# Patient Record
Sex: Female | Born: 1970 | Race: White | Hispanic: No | Smoking: Current every day smoker
Health system: Southern US, Community
[De-identification: ages and names within clinical notes are randomized; demographics above are authoritative.]

## PROBLEM LIST (undated history)

## (undated) HISTORY — PX: ESOPHAGEAL DILATION: SHX303

---

## 2006-05-30 ENCOUNTER — Emergency Department (HOSPITAL_COMMUNITY): Admission: EM | Admit: 2006-05-30 | Discharge: 2006-05-30 | Payer: Self-pay | Admitting: Emergency Medicine

## 2006-06-09 ENCOUNTER — Ambulatory Visit: Payer: Self-pay | Admitting: *Deleted

## 2006-06-09 ENCOUNTER — Inpatient Hospital Stay (HOSPITAL_COMMUNITY): Admission: AD | Admit: 2006-06-09 | Discharge: 2006-06-14 | Payer: Self-pay | Admitting: *Deleted

## 2009-08-02 ENCOUNTER — Ambulatory Visit: Payer: Self-pay | Admitting: Interventional Radiology

## 2009-08-02 ENCOUNTER — Emergency Department (HOSPITAL_BASED_OUTPATIENT_CLINIC_OR_DEPARTMENT_OTHER): Admission: EM | Admit: 2009-08-02 | Discharge: 2009-08-02 | Payer: Self-pay | Admitting: Emergency Medicine

## 2009-08-12 ENCOUNTER — Emergency Department (HOSPITAL_COMMUNITY): Admission: EM | Admit: 2009-08-12 | Discharge: 2009-08-12 | Payer: Self-pay | Admitting: Emergency Medicine

## 2010-07-25 NOTE — H&P (Signed)
NAMESAUMYA, Alyssa Rodgers NO.:  [id number]   MEDICAL RECORD NO.:  [id number]          PATIENT TYPE:  IPS   LOCATION:  0503                          FACILITY:  [facility]   PHYSICIAN:  Jasmine Pang, M.D. DATE OF BIRTH:  04/04/1970   DATE OF ADMISSION:  06/09/2006  DATE OF DISCHARGE:                       PSYCHIATRIC ADMISSION ASSESSMENT   This a 40 year old separated white female involuntary committed on  06/08/2006.   HISTORY OF PRESENT ILLNESS:  The patient presents with a history  depression, anxiety, having suicidal thoughts with different plans that  she has not acted on.  The patient is here on petition.  Papers state  the patient was having suicidal thoughts of multiple plans and  significant substance abuse over the past 5 months.  The patient reports  that she has a history of alcohol, cocaine and opiate use.  Her drinking  and drug use have escalated after separating from her husband.  She has  been drinking approximately a six pack daily.  She started using at the  age of 38, was introduced with some substances through her parents.  Denies any psychotic symptoms.   PAST PSYCHIATRIC HISTORY:  First admission to [facility].  She was told that she may be bipolar.  In the past has been on Zoloft,  Wellbutrin and Effexor.   SOCIAL HISTORY:  A 40 year old separated white female, has two children.  She states her mother lives with her.  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   FAMILY HISTORY:  Chart indicates father committed suicide.  Mother  history of substance abuse.   ALCOHOL AND DRUG HISTORY:  The patient smokes.  The patient has been  using several substances.  Denies any IV drug use.  Denies any seizures  or blackouts.  Primary care Summar Mcglothlin is none.   MEDICAL PROBLEMS:  Denies any acute or chronic health issues.   MEDICATIONS:  None.   DRUG ALLERGIES:  None.   PHYSICAL EXAMINATION:  GENERAL:  The patient was assessed at  [facility].  VITAL SIGNS:  Her temperature is 97.7, 118 heart rate, 16 respirations,  blood pressure 124/67, 99% saturated, approximately 5 feet 4 inches  tall, approximately 135 pounds.   LABORATORY DATA:  Pregnancy test is negative.  Urinalysis is negative.  Drug screen is negative.  Her CBC is within normal limits.  Alcohol  level is less than 10.  This is a young female, standing up in her room,  looking out the window, very tearful and agitated.   MENTAL STATUS EXAM:  Fully alert.  She is in her room.  There is little  eye contact.  She is sobbing throughout most of the interview.  Speech  is clear, normal rate and tone.  The patient's mood is depressed and  anxious.  The patient is tearful and agitated.  Thought processes are  coherent.  There is no evidence of psychosis.  Cognitive function  intact.  Memory is fair.  Judgment is fair.  Insight is fair.  Poor  impulse control.  Concentration somewhat decreased at this time.  AXIS I:  Depressive disorder NOS, polysubstance abuse.  Rule out  dependence.  AXIS II:  Deferred.  AXIS III:  No acute or chronic health issues.  AXIS IV:  Problems with primary support group, possible problems with  occupation.  The patient is concerned about being admitted and losing  her job.  Other psychosocial problems.  AXIS V:  Current is 35.   PLAN:  Contract for safety.  Stabilize mood thinking.  We will put  patient on Librium protocol.  Will have Risperdal available on a p.r.n.  basis.  Work on relapse prevention.  Case manager is to assess follow-up  and will consider family session with the patient's support group.  The  patient is to remain alcohol and drug-free, be medication compliant and  attend individual group therapy to increase coping skills.  Length of  stay is 4 to 5 days.      Landry Corporal, N.P.      Jasmine Pang, M.D.  Electronically Signed    JO/MEDQ  D:  06/10/2006  T:  06/10/2006  Job:  [id number]

## 2010-07-25 NOTE — Discharge Summary (Signed)
NAMEABBEYGAIL, IGOE NO.:  [id number]   MEDICAL RECORD NO.:  [id number]          PATIENT TYPE:  IPS   LOCATION:  0503                          FACILITY:  [facility]   PHYSICIAN:  Geoffery Lyons, M.D.      DATE OF BIRTH:  1970-03-14   DATE OF ADMISSION:  06/09/2006  DATE OF DISCHARGE:  06/14/2006                               DISCHARGE SUMMARY   CHIEF COMPLAINT/HISTORY OF PRESENT ILLNESS:  This is the first admission  to [facility] for this 40 year old separated white  female involuntarily committed with history of depression, anxiety,  having suicidal thoughts with different plans that she had up to the  note acted on. She was involuntarily committed.  She was having suicidal  thoughts of multiple plan with significant substance abuse over the past  25-months. History of alcohol and cocaine and opiate use. Drinking and  drug use escalated after separated from her husband. Drinking a six-pack  daily.   PAST PSYCHIATRIC HISTORY:  [facility] Told she might  be bipolar.  She had been on Zoloft, Wellbutrin and Effexor.   ALCOHOL AND DRUG HISTORY:  Endorsed using several substances. History of  alcohol and cocaine and opiate use but the use has escalated, a six-pack  daily.  Started using age 18 was introduced into some substance through  her parents as she claims.   MEDICAL HISTORY:  Denies a history of any major medical conditions.   MEDICATIONS:  None.   PHYSICAL EXAMINATION:  Performed and failed to show any acute findings.   LABORATORY WORK:  Pregnancy test negative. Drug screening negative.  CBC  within normal limits.  White blood cells 7.8, hemoglobin 15.4 sodium  141, potassium 3.9, glucose 11.   MENTAL STATUS EXAM:  Upon admission revealed an alert cooperative  female.  Little eye contact. Initially sobbing throughout most of the  interview.  Speech was clear, normal in rate and tone, depressed and  anxious, tearful,  agitated.  Thought processes logical, coherent and  relevant.  No evidence of delusions, suicidal ruminations no plan.  No  homicidal ideas.  Cognition well-preserved.   ADMISSION DIAGNOSES:  AXIS I: Major depressive disorder, alcohol,  cocaine and opiate abuse, rule out dependence.  AXIS II: No diagnosis.  AXIS III:  No diagnosis.  AXIS IV: Moderate.  AXIS V:  On admission 35, highest GAF in the last year 60.   COURSE IN THE HOSPITAL:  She was admitted.  She was started in  individual and group psychotherapy.  She was detoxified with Librium,  given Ambien for sleep. Some Risperdal was added and she was given  trazodone for sleep when the Ambien was not effective. She endorsed that  she had been supporting everyone for 10 years as she claims and she  cannot get support back. Has no friends. Thought she was selfish, career  driven.  She was career-driven, got pregnant, was wanting to go to [place] and settled for [facility]. Separated in October. In a new  relationship.  Seven and nine-year-old children.  Three years  of drug  use cocaine, opiates. Endorses shoulder pain. She was fired from a job  she liked.  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 Has been in a full time job for the last  month.  Does not want to be with the children, having a hard time  dealing with them. Wants to get herself back cannot not stand her  mother. When raising the children wine, beer increased use, then  decreased to six pack a day. On April 4th she was somatically focused,  symptom focused, anxiety building up to agitation, worried about her  situation, wanting to meet with the husband and his father as wanted to  be sure that they understand that at this particular time she could not  take care of the children and wanted them to known that she did not want  to give the children away, but that she needs some time to take care of  herself.  We  pursued detox, reassessed comorbidities such as bipolar  mood disorder NOS versus substance-induced mood disorder.  Family  session with the husband from whom  she is separated and her father. The  husband agreed to keep the children as long as she needed. He was  supportive.  By April 6th she had finished the detox.  She felt she did  well on Wellbutrin.  She wanted to get back it.  Concern about other  medication side effects especially of sexual nature.  She continued to  improve.  Her mood drastically improved.  Her affect became brighter.  By April the 7th she was in full contact with reality.  There were no  active suicidal or homicidal ideas, no hallucinations or delusions.  Committed to abstinence.   DISCHARGE DIAGNOSES:  AXIS I: Alcohol, cocaine and opiate abuse. Mood  disorder NOS.  AXIS II: No diagnosis.  AXIS III:  No diagnosis.  AXIS IV: Moderate.  AXIS V:  GAF upon discharge 50.   DISCHARGE MEDICATIONS:  1. Wellbutrin XL 150 mg per day.  2. Neurontin 100 twice a day and at bedtime.  3. Trazodone 50 mg one to two at bedtime for sleep.   FOLLOW UP:  [facility] in [place].      Geoffery Lyons, M.D.  Electronically Signed     IL/MEDQ  D:  07/14/2006  T:  07/14/2006  Job:  [id number]

## 2016-11-17 ENCOUNTER — Encounter (HOSPITAL_BASED_OUTPATIENT_CLINIC_OR_DEPARTMENT_OTHER): Payer: Self-pay | Admitting: Emergency Medicine

## 2016-11-17 ENCOUNTER — Emergency Department (HOSPITAL_BASED_OUTPATIENT_CLINIC_OR_DEPARTMENT_OTHER)
Admission: EM | Admit: 2016-11-17 | Discharge: 2016-11-17 | Disposition: A | Discharge status: Home / Self Care | Payer: Self-pay | Attending: Emergency Medicine | Admitting: Emergency Medicine

## 2016-11-17 ENCOUNTER — Emergency Department (HOSPITAL_BASED_OUTPATIENT_CLINIC_OR_DEPARTMENT_OTHER): Payer: Self-pay

## 2016-11-17 DIAGNOSIS — R05 Cough: Secondary | ICD-10-CM

## 2016-11-17 DIAGNOSIS — R059 Cough, unspecified: Secondary | ICD-10-CM

## 2016-11-17 DIAGNOSIS — F17200 Nicotine dependence, unspecified, uncomplicated: Secondary | ICD-10-CM | POA: Insufficient documentation

## 2016-11-17 DIAGNOSIS — Y929 Unspecified place or not applicable: Secondary | ICD-10-CM | POA: Insufficient documentation

## 2016-11-17 DIAGNOSIS — T63441A Toxic effect of venom of bees, accidental (unintentional), initial encounter: Secondary | ICD-10-CM | POA: Insufficient documentation

## 2016-11-17 MED ORDER — BENZONATATE 100 MG PO CAPS: 100.0000 mg | ORAL_CAPSULE | Freq: Three times a day (TID) | ORAL | 0 refills | Status: AC

## 2016-11-17 NOTE — ED Provider Notes (Signed)
[facility] Provider Note   CSN: [id number] Arrival date & time: 11/17/16  1921     History   Chief Complaint Chief Complaint  Patient presents with  . Cough    HPI Alyssa Rodgers is a 46 y.o. female.  Patient presents with a chief complaint of cough and bee sting. Patient states that she was stung by a bee on her left arm 5 days ago with significant swelling and warmth for 3 days afterwards. Symptoms are much improved over the past 2 days with Benadryl. She also has paroxysms of cough. Nonproductive. No fevers. Minor nasal congestion. Patient is unsure if the cough is related to the bee stings or from exposure to dust and dirt outdoors. No wheezing.      History reviewed. No pertinent past medical history.  There are no active problems to display for this patient.   Past Surgical History:  Procedure Laterality Date  . ESOPHAGEAL DILATION      OB History    No data available       Home Medications    Prior to Admission medications   Medication Sig Start Date End Date Taking? Authorizing Provider  benzonatate (TESSALON) 100 MG capsule Take 1 capsule (100 mg total) by mouth every 8 (eight) hours. 11/17/16   Renne Crigler, PA-C    Family History No family history on file.  Social History Social History  Substance Use Topics  . Smoking status: Current Every Day Smoker  . Smokeless tobacco: Never Used  . Alcohol use Yes     Comment: occ.     Allergies   Patient has no known allergies.   Review of Systems Review of Systems  Constitutional: Negative for fever.  HENT: Negative for rhinorrhea and sore throat.   Eyes: Negative for redness.  Respiratory: Positive for cough. Negative for shortness of breath.   Cardiovascular: Negative for chest pain.  Gastrointestinal: Negative for abdominal pain, diarrhea, nausea and vomiting.  Genitourinary: Negative for dysuria.  Musculoskeletal: Positive for myalgias.  Skin: Positive for color change.  Negative for rash.  Neurological: Negative for headaches.     Physical Exam Updated Vital Signs BP 138/65 (BP Location: Right Arm)   Pulse 83   Temp 98.4 F (36.9 C) (Oral)   Resp 18   Ht  (1.626 m)   Wt 68 kg (150 lb)   LMP 11/10/2016   SpO2 100%   BMI 25.75 kg/m   Physical Exam  Constitutional: She appears well-developed and well-nourished.  HENT:  Head: Normocephalic and atraumatic.  Mouth/Throat: Oropharynx is clear and moist.  Eyes: Conjunctivae are normal.  Neck: Normal range of motion. Neck supple.  Pulmonary/Chest: No respiratory distress. She has no wheezes. She has no rales.  Neurological: She is alert.  Skin: Skin is warm and dry.  Patient with mild area of erythema without palpable abscess to left upper arm. Minor warmth.  Patient with also resolving bee sting to right ankle.  Psychiatric: She has a normal mood and affect.  Nursing note and vitals reviewed.    ED Treatments / Results   Radiology Dg Chest 2 View  Result Date: 11/17/2016 CLINICAL DATA:  Cough for several days following being stung by a bee, initial encounter EXAM: CHEST  2 VIEW COMPARISON:  None. FINDINGS: The heart size and mediastinal contours are within normal limits. Both lungs are clear. The visualized skeletal structures are unremarkable. IMPRESSION: No active cardiopulmonary disease. Electronically Signed   By: Eulah Pont.D.  On: 11/17/2016 20:02    Procedures Procedures (including critical care time)  Medications Ordered in ED Medications - No data to display   Initial Impression / Assessment and Plan / ED Course  I have reviewed the triage vital signs and the nursing notes.  Pertinent labs & imaging results that were available during my care of the patient were reviewed by me and considered in my medical decision making (see chart for details).     Patient seen and examined.   Vital signs reviewed and are as follows: BP 138/65 (BP Location: Right Arm)   Pulse  83   Temp 98.4 F (36.9 C) (Oral)   Resp 18   Ht 5\' 4"  (1.626 m)   Wt 68 kg (150 lb)   LMP 11/10/2016   SpO2 100%   BMI 25.75 kg/m   Patient updated on chest x-ray results. Do not suspect cellulitis at this time.  Patient to continue antihistamines, ice, elevation as needed. Home with tessalon for cough.   Pt urged to return with worsening pain, worsening swelling, expanding area of redness or streaking up extremity, fever, or any other concerns. Pt verbalizes understanding and agrees with plan.   Final Clinical Impressions(s) / ED Diagnoses   Final diagnoses:  Cough  Bee sting, accidental or unintentional, initial encounter   Recent bee stings: Patient reports much improvement over the past several days. Suspect that these are resolving. No obvious secondary cellulitis on exam. No abscess.  Cough: Nonproductive, no other systemic symptoms. Chest x-rays negative. Conservative measures indicated.  New Prescriptions New Prescriptions   BENZONATATE (TESSALON) 100 MG CAPSULE    Take 1 capsule (100 mg total) by mouth every 8 (eight) hours.     Renne CriglerGeiple, Adaley Kiene, PA-C 11/17/16 2131    Nira Connardama, Pedro Eduardo, MD 11/18/16 [phone]

## 2016-11-17 NOTE — Discharge Instructions (Signed)
Please read and follow all provided instructions.  Your diagnoses today include:  1. Cough   2. Bee sting, accidental or unintentional, initial encounter     Tests performed today include:  Chest x-ray - does not show any pneumonia  Vital signs. See below for your results today.   Medications prescribed:   Tessalon Perles - cough suppressant medication  Take any prescribed medications only as directed.  Home care instructions:  Follow any educational materials contained in this packet.  Follow-up instructions: Please follow-up with your primary care provider in the next 3 days for further evaluation of your symptoms and a recheck if you are not feeling better.   Return instructions:   Please return to the Emergency Department if you experience worsening symptoms.  Please return with worsening wheezing, shortness of breath, or difficulty breathing.  Return with persistent fever above 101F.   Return with worsening pain, worsening swelling, expanding area of redness or streaking up extremity, fever, or any other concerns.  Please return if you have any other emergent concerns.  Additional Information:  Your vital signs today were: BP 138/65 (BP Location: Right Arm)    Pulse 83    Temp 98.4 F (36.9 C) (Oral)    Resp 18    Ht 5\' 4"  (1.626 m)    Wt 68 kg (150 lb)    LMP 11/10/2016    SpO2 100%    BMI 25.75 kg/m  If your blood pressure (BP) was elevated above 135/85 this visit, please have this repeated by your doctor within one month. --------------

## 2016-11-17 NOTE — ED Notes (Signed)
URI and allergy symptoms

## 2016-11-17 NOTE — ED Triage Notes (Signed)
Patient states that she was stung by bees about 2 weeks ago and then 2 -3 days ago. She reports coughing since, reports that sometimes it is so hard she throws up

## 2017-10-16 ENCOUNTER — Emergency Department (HOSPITAL_COMMUNITY): Payer: No Typology Code available for payment source

## 2017-10-16 ENCOUNTER — Emergency Department (HOSPITAL_COMMUNITY)
Admission: EM | Admit: 2017-10-16 | Discharge: 2017-10-16 | Disposition: A | Discharge status: Home / Self Care | Payer: No Typology Code available for payment source | Attending: Emergency Medicine | Admitting: Emergency Medicine

## 2017-10-16 ENCOUNTER — Encounter (HOSPITAL_COMMUNITY): Payer: Self-pay | Admitting: Emergency Medicine

## 2017-10-16 ENCOUNTER — Other Ambulatory Visit: Payer: Self-pay

## 2017-10-16 DIAGNOSIS — Y929 Unspecified place or not applicable: Secondary | ICD-10-CM | POA: Insufficient documentation

## 2017-10-16 DIAGNOSIS — Y939 Activity, unspecified: Secondary | ICD-10-CM | POA: Diagnosis not present

## 2017-10-16 DIAGNOSIS — S0990XA Unspecified injury of head, initial encounter: Secondary | ICD-10-CM

## 2017-10-16 DIAGNOSIS — F17200 Nicotine dependence, unspecified, uncomplicated: Secondary | ICD-10-CM | POA: Insufficient documentation

## 2017-10-16 DIAGNOSIS — Z79899 Other long term (current) drug therapy: Secondary | ICD-10-CM | POA: Diagnosis not present

## 2017-10-16 DIAGNOSIS — R51 Headache: Secondary | ICD-10-CM | POA: Diagnosis not present

## 2017-10-16 DIAGNOSIS — M542 Cervicalgia: Secondary | ICD-10-CM | POA: Insufficient documentation

## 2017-10-16 DIAGNOSIS — Y999 Unspecified external cause status: Secondary | ICD-10-CM | POA: Insufficient documentation

## 2017-10-16 DIAGNOSIS — S9032XA Contusion of left foot, initial encounter: Secondary | ICD-10-CM | POA: Insufficient documentation

## 2017-10-16 DIAGNOSIS — M79652 Pain in left thigh: Secondary | ICD-10-CM | POA: Insufficient documentation

## 2017-10-16 LAB — CBC WITH DIFFERENTIAL/PLATELET
ABS IMMATURE GRANULOCYTES: 0 10*3/uL (ref 0.0–0.1)
BASOS PCT: 0 %
Basophils Absolute: 0 10*3/uL (ref 0.0–0.1)
EOS ABS: 0.1 10*3/uL (ref 0.0–0.7)
Eosinophils Relative: 1 %
HEMATOCRIT: 39.8 % (ref 36.0–46.0)
Hemoglobin: 13.4 g/dL (ref 12.0–15.0)
IMMATURE GRANULOCYTES: 0 %
LYMPHS ABS: 1.2 10*3/uL (ref 0.7–4.0)
Lymphocytes Relative: 15 %
MCH: 30.8 pg (ref 26.0–34.0)
MCHC: 33.7 g/dL (ref 30.0–36.0)
MCV: 91.5 fL (ref 78.0–100.0)
MONO ABS: 0.6 10*3/uL (ref 0.1–1.0)
Monocytes Relative: 8 %
Neutro Abs: 5.9 10*3/uL (ref 1.7–7.7)
Neutrophils Relative %: 76 %
PLATELETS: 252 10*3/uL (ref 150–400)
RBC: 4.35 MIL/uL (ref 3.87–5.11)
RDW: 12.6 % (ref 11.5–15.5)
WBC: 7.8 10*3/uL (ref 4.0–10.5)

## 2017-10-16 LAB — BASIC METABOLIC PANEL
ANION GAP: 9 (ref 5–15)
BUN: 11 mg/dL (ref 6–20)
CO2: 26 mmol/L (ref 22–32)
Calcium: 9.8 mg/dL (ref 8.9–10.3)
Chloride: 106 mmol/L (ref 98–111)
Creatinine, Ser: 0.91 mg/dL (ref 0.44–1.00)
GFR calc Af Amer: >60 mL/min (ref >60)
Glucose, Bld: 112 mg/dL — ABNORMAL HIGH (ref 70–99)
POTASSIUM: 3.4 mmol/L — AB (ref 3.5–5.1)
Sodium: 141 mmol/L (ref 135–145)

## 2017-10-16 MED ORDER — CYCLOBENZAPRINE HCL 10 MG PO TABS: 10.0000 mg | ORAL_TABLET | Freq: Two times a day (BID) | ORAL | 0 refills | Status: AC | PRN

## 2017-10-16 MED ORDER — FENTANYL CITRATE (PF) 100 MCG/2ML IJ SOLN
50.0000 ug | Freq: Once | INTRAMUSCULAR | Status: AC
  Administered 2017-10-16: 50 ug via INTRAVENOUS
  Filled 2017-10-16: qty 2

## 2017-10-16 MED ORDER — HYDROCODONE-ACETAMINOPHEN 5-325 MG PO TABS: 1.0000 | ORAL_TABLET | ORAL | 0 refills | Status: AC | PRN

## 2017-10-16 MED ORDER — SODIUM CHLORIDE 0.9 % IV BOLUS
1000.0000 mL | Freq: Once | INTRAVENOUS | Status: AC
  Administered 2017-10-16: 1000 mL via INTRAVENOUS
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
## 2017-10-16 NOTE — Discharge Instructions (Signed)
X-ray showed no broken bones.  You will be sore for several days.  For your foot elevate, ice, firm shoe.  Prescription for pain medicine and muscle relaxer.

## 2017-10-16 NOTE — ED Notes (Signed)
Patient given discharge instructions and verbalized understanding.  Patient stable to discharge at this time.  Patient is alert and oriented to baseline.  No distressed noted at this time.  All belongings taken with the patient at discharge.   
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
## 2019-04-07 IMAGING — CR DG FEMUR 2+V*L*
4 series · 4 of 4 positions shown · non-contrast
Comparison: None.

CLINICAL DATA: Left lateral hip pain.  Left foot pain.

EXAM:
LEFT FEMUR 2 VIEWS

[femur ap (1 of 2)]
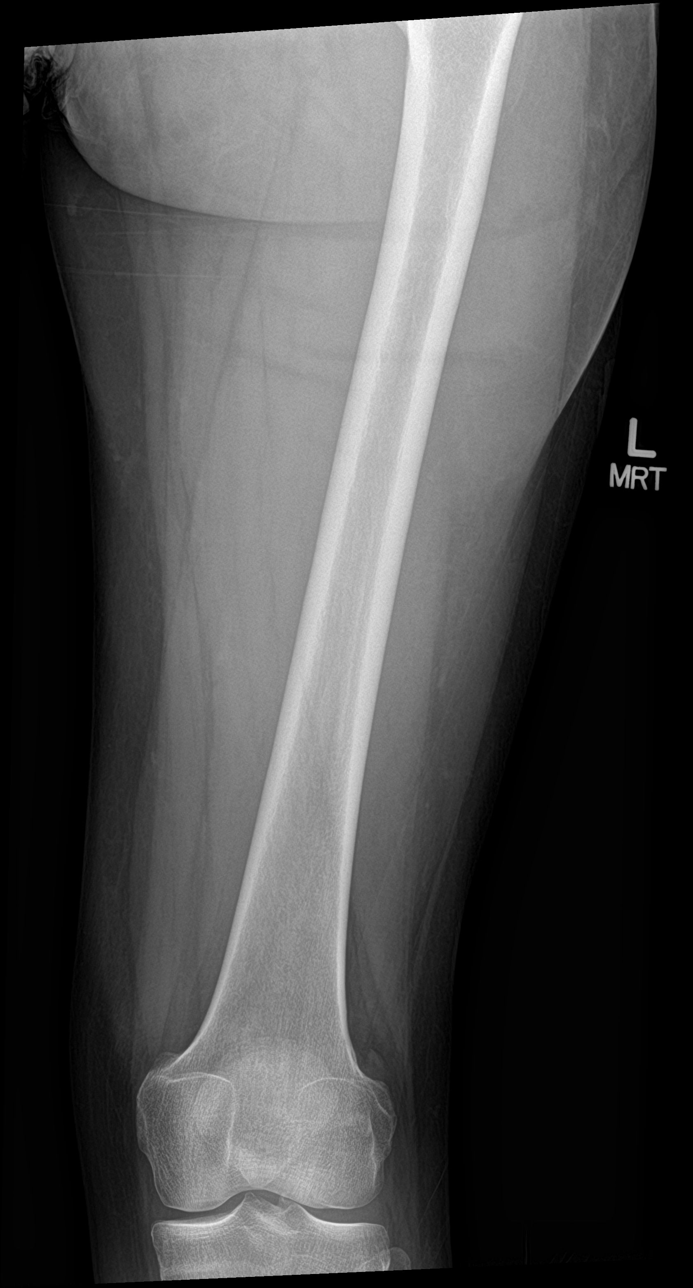

[femur ap (2 of 2)]
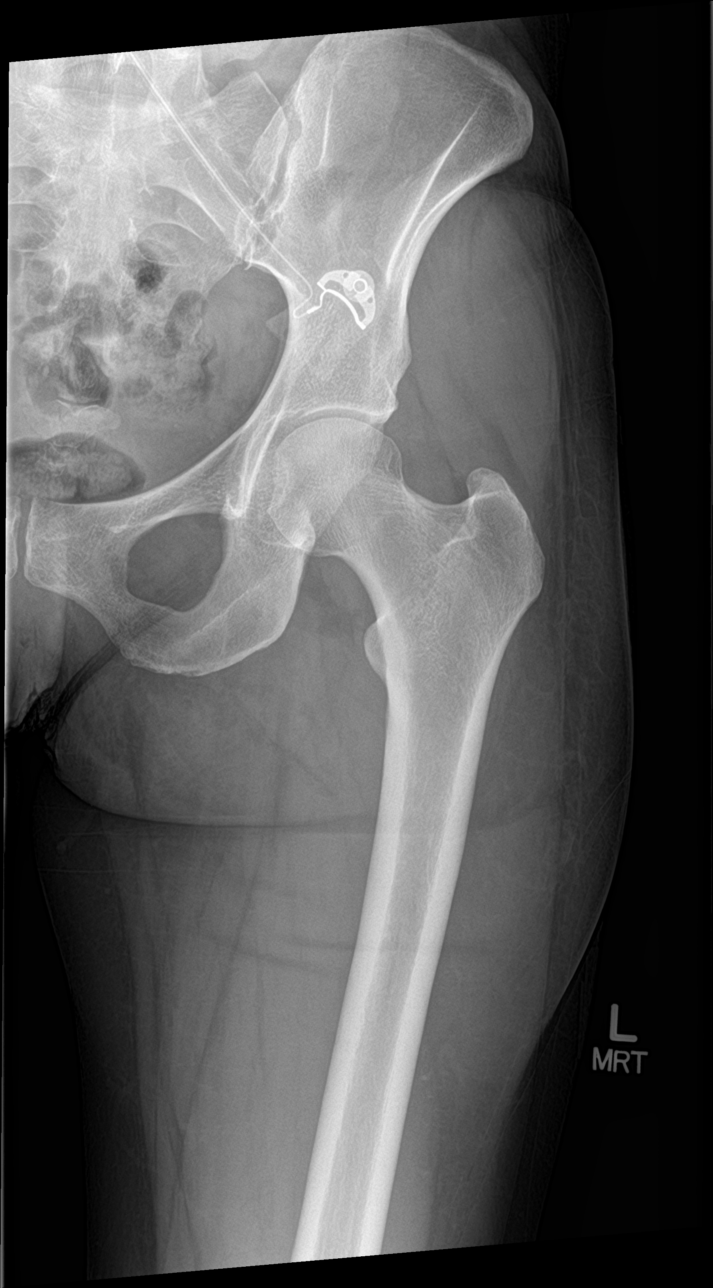

[femur lat (1 of 2)]
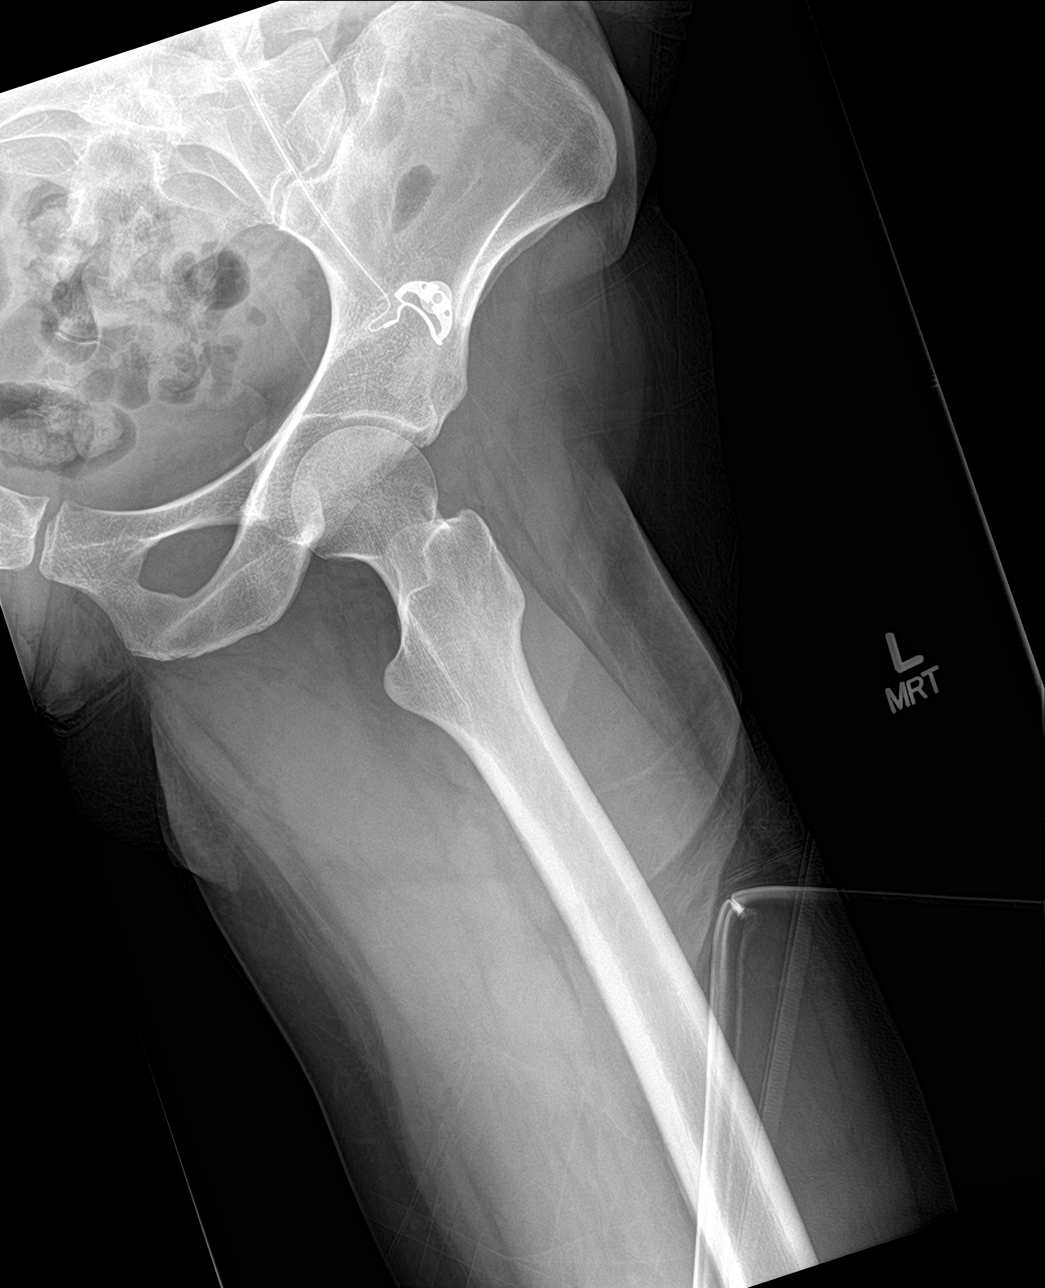

[femur lat (2 of 2)]
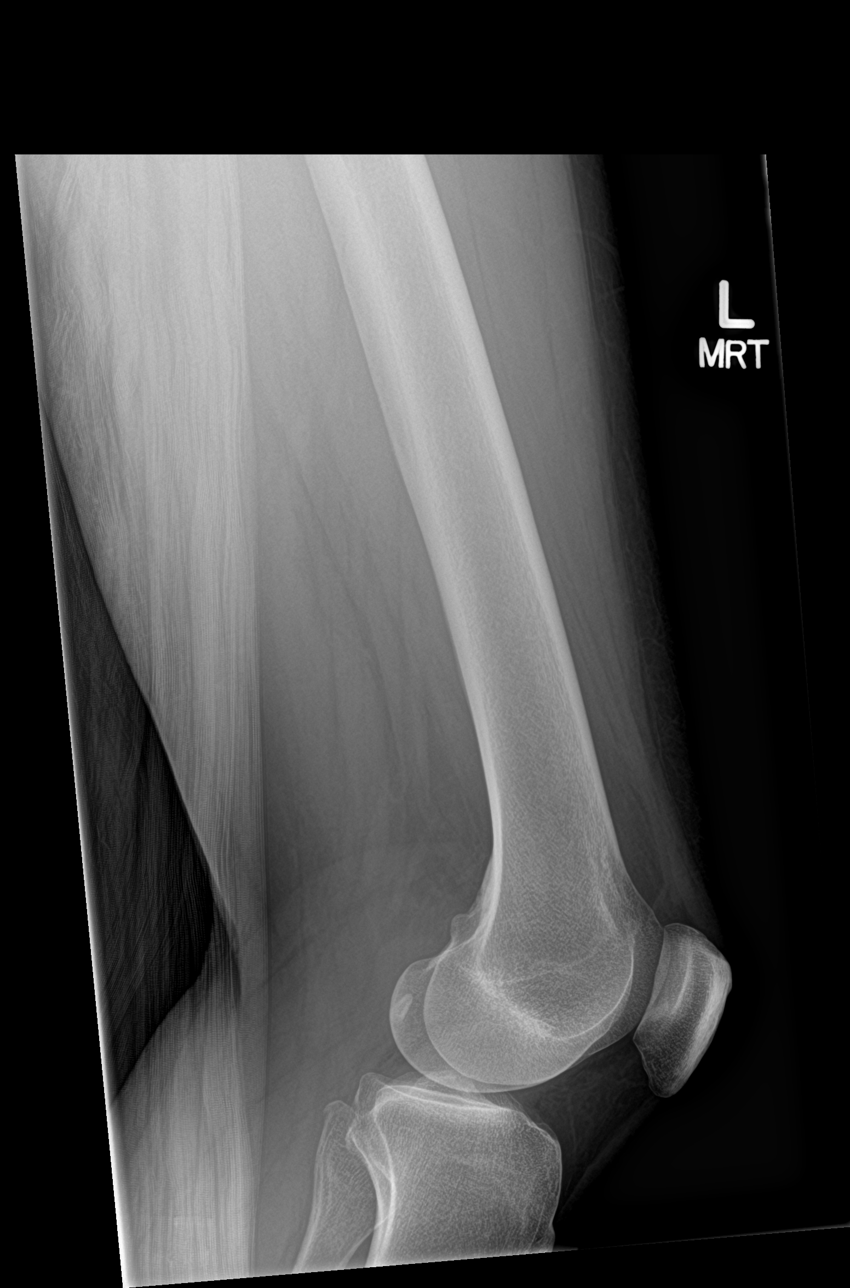

[4 of 4 positions shown; findings below may reference images not displayed]

FINDINGS: There is no evidence of fracture or other focal bone lesions. Soft
tissues are unremarkable.
IMPRESSION: No acute osseous injury of the left femur.
